# Patient Record
Sex: Female | Born: 1965 | Race: White | Hispanic: No | Marital: Married | Smoking: Never smoker
Health system: Southern US, Community
[De-identification: ages and names within clinical notes are randomized; demographics above are authoritative.]

## PROBLEM LIST (undated history)

## (undated) DIAGNOSIS — E7800 Pure hypercholesterolemia, unspecified: Secondary | ICD-10-CM

## (undated) DIAGNOSIS — B9562 Methicillin resistant Staphylococcus aureus infection as the cause of diseases classified elsewhere: Secondary | ICD-10-CM

## (undated) DIAGNOSIS — I10 Essential (primary) hypertension: Secondary | ICD-10-CM

## (undated) DIAGNOSIS — L0390 Cellulitis, unspecified: Secondary | ICD-10-CM

## (undated) HISTORY — PX: ABDOMINAL HYSTERECTOMY: SHX81

---

## 2003-01-16 ENCOUNTER — Encounter: Payer: Self-pay | Admitting: Emergency Medicine

## 2003-01-16 ENCOUNTER — Emergency Department (HOSPITAL_COMMUNITY): Admission: EM | Admit: 2003-01-16 | Discharge: 2003-01-16 | Payer: Self-pay | Admitting: Emergency Medicine

## 2003-04-14 ENCOUNTER — Emergency Department (HOSPITAL_COMMUNITY): Admission: EM | Admit: 2003-04-14 | Discharge: 2003-04-15 | Payer: Self-pay

## 2005-04-05 ENCOUNTER — Encounter: Admission: RE | Admit: 2005-04-05 | Discharge: 2005-04-05 | Payer: Self-pay | Admitting: Family Medicine

## 2007-08-19 ENCOUNTER — Ambulatory Visit: Payer: Self-pay | Admitting: Surgery

## 2008-11-16 ENCOUNTER — Emergency Department (HOSPITAL_BASED_OUTPATIENT_CLINIC_OR_DEPARTMENT_OTHER): Admission: EM | Admit: 2008-11-16 | Discharge: 2008-11-16 | Payer: Self-pay | Admitting: Emergency Medicine

## 2008-12-27 ENCOUNTER — Emergency Department (HOSPITAL_BASED_OUTPATIENT_CLINIC_OR_DEPARTMENT_OTHER): Admission: EM | Admit: 2008-12-27 | Discharge: 2008-12-27 | Payer: Self-pay | Admitting: Emergency Medicine

## 2008-12-27 ENCOUNTER — Ambulatory Visit: Payer: Self-pay | Admitting: Radiology

## 2010-04-20 IMAGING — CR DG CHEST 2V
2 series · 2 of 2 positions shown · non-contrast
Comparison: None

CLINICAL DATA: Right eye infection.  productive cough.
Hypertension.

CHEST - 2 VIEW

[w chest pa]
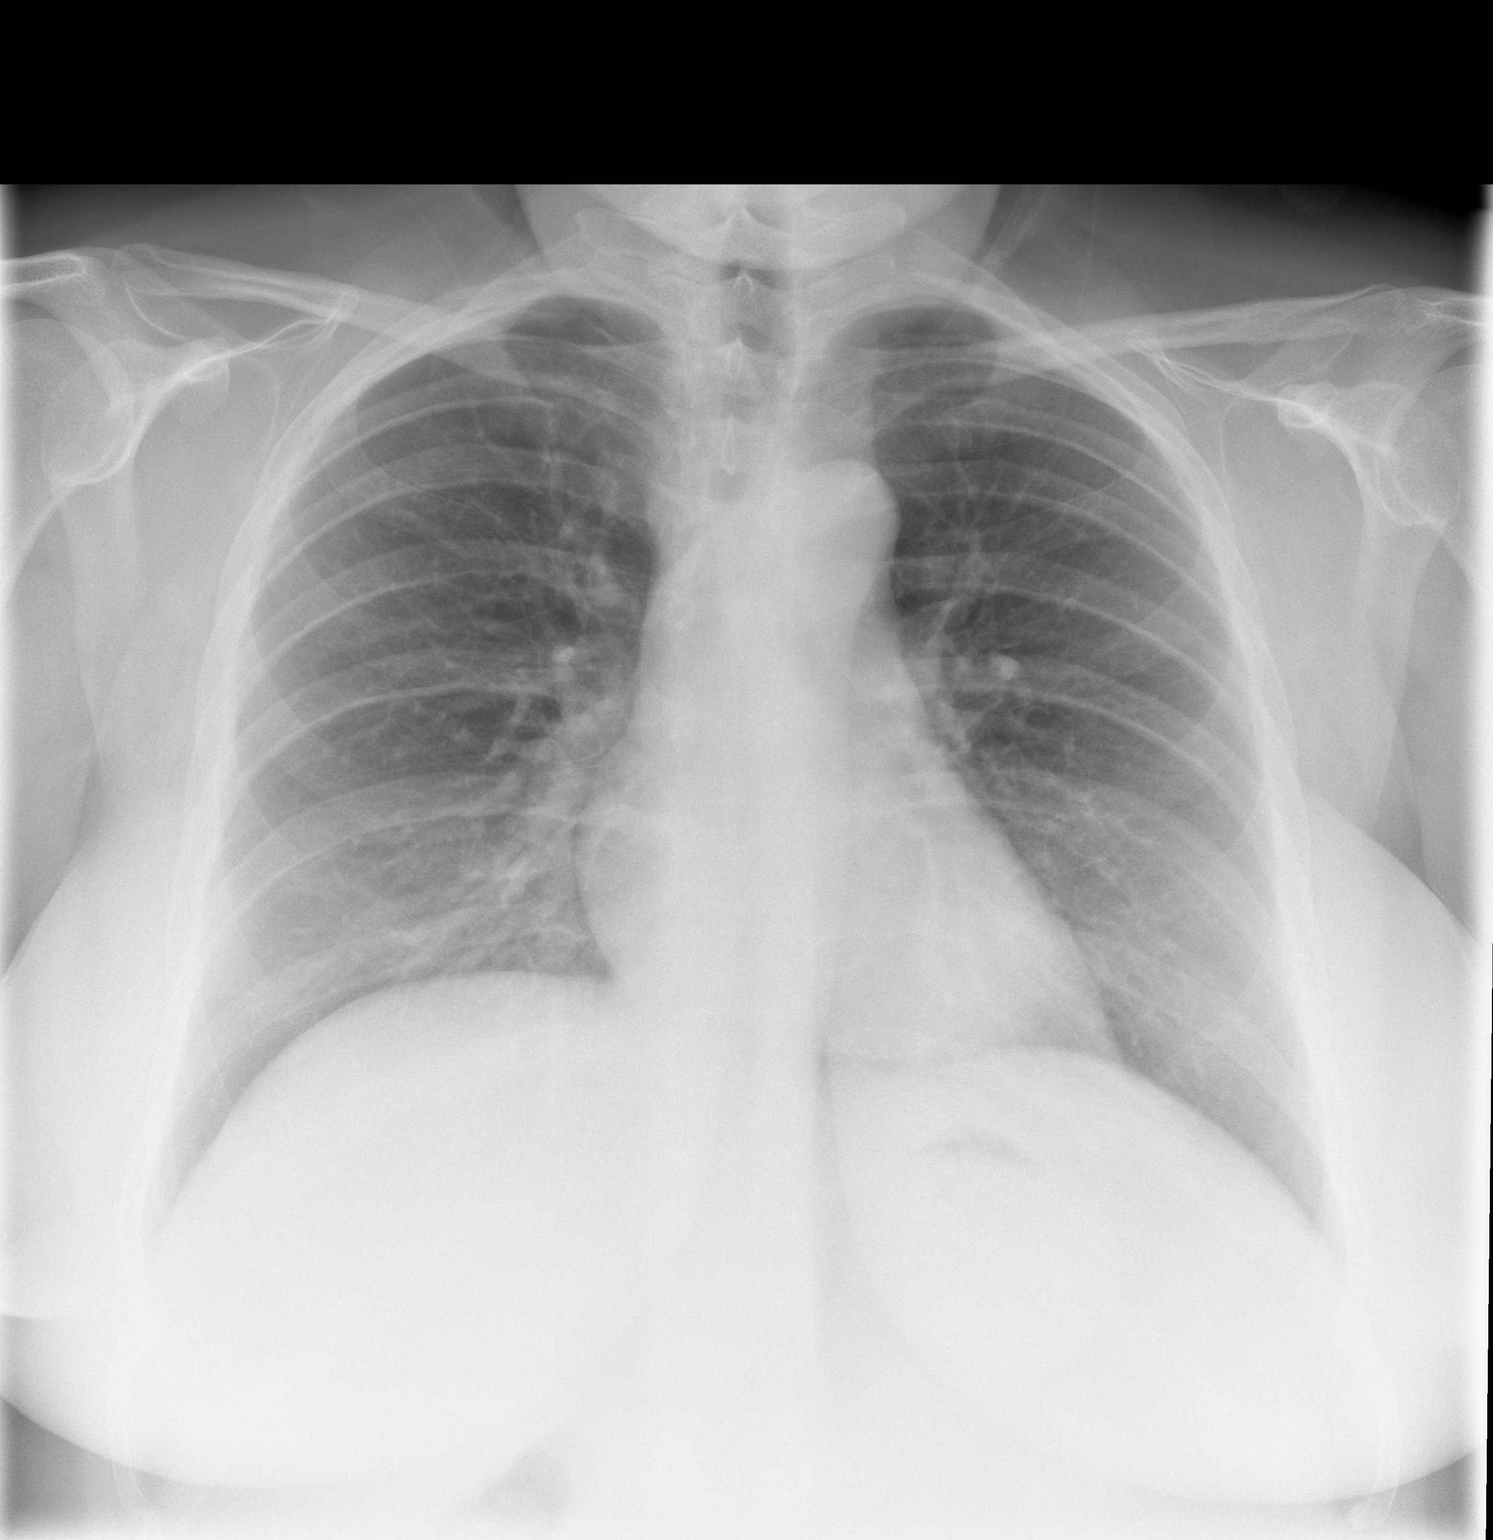

[w chest lat]
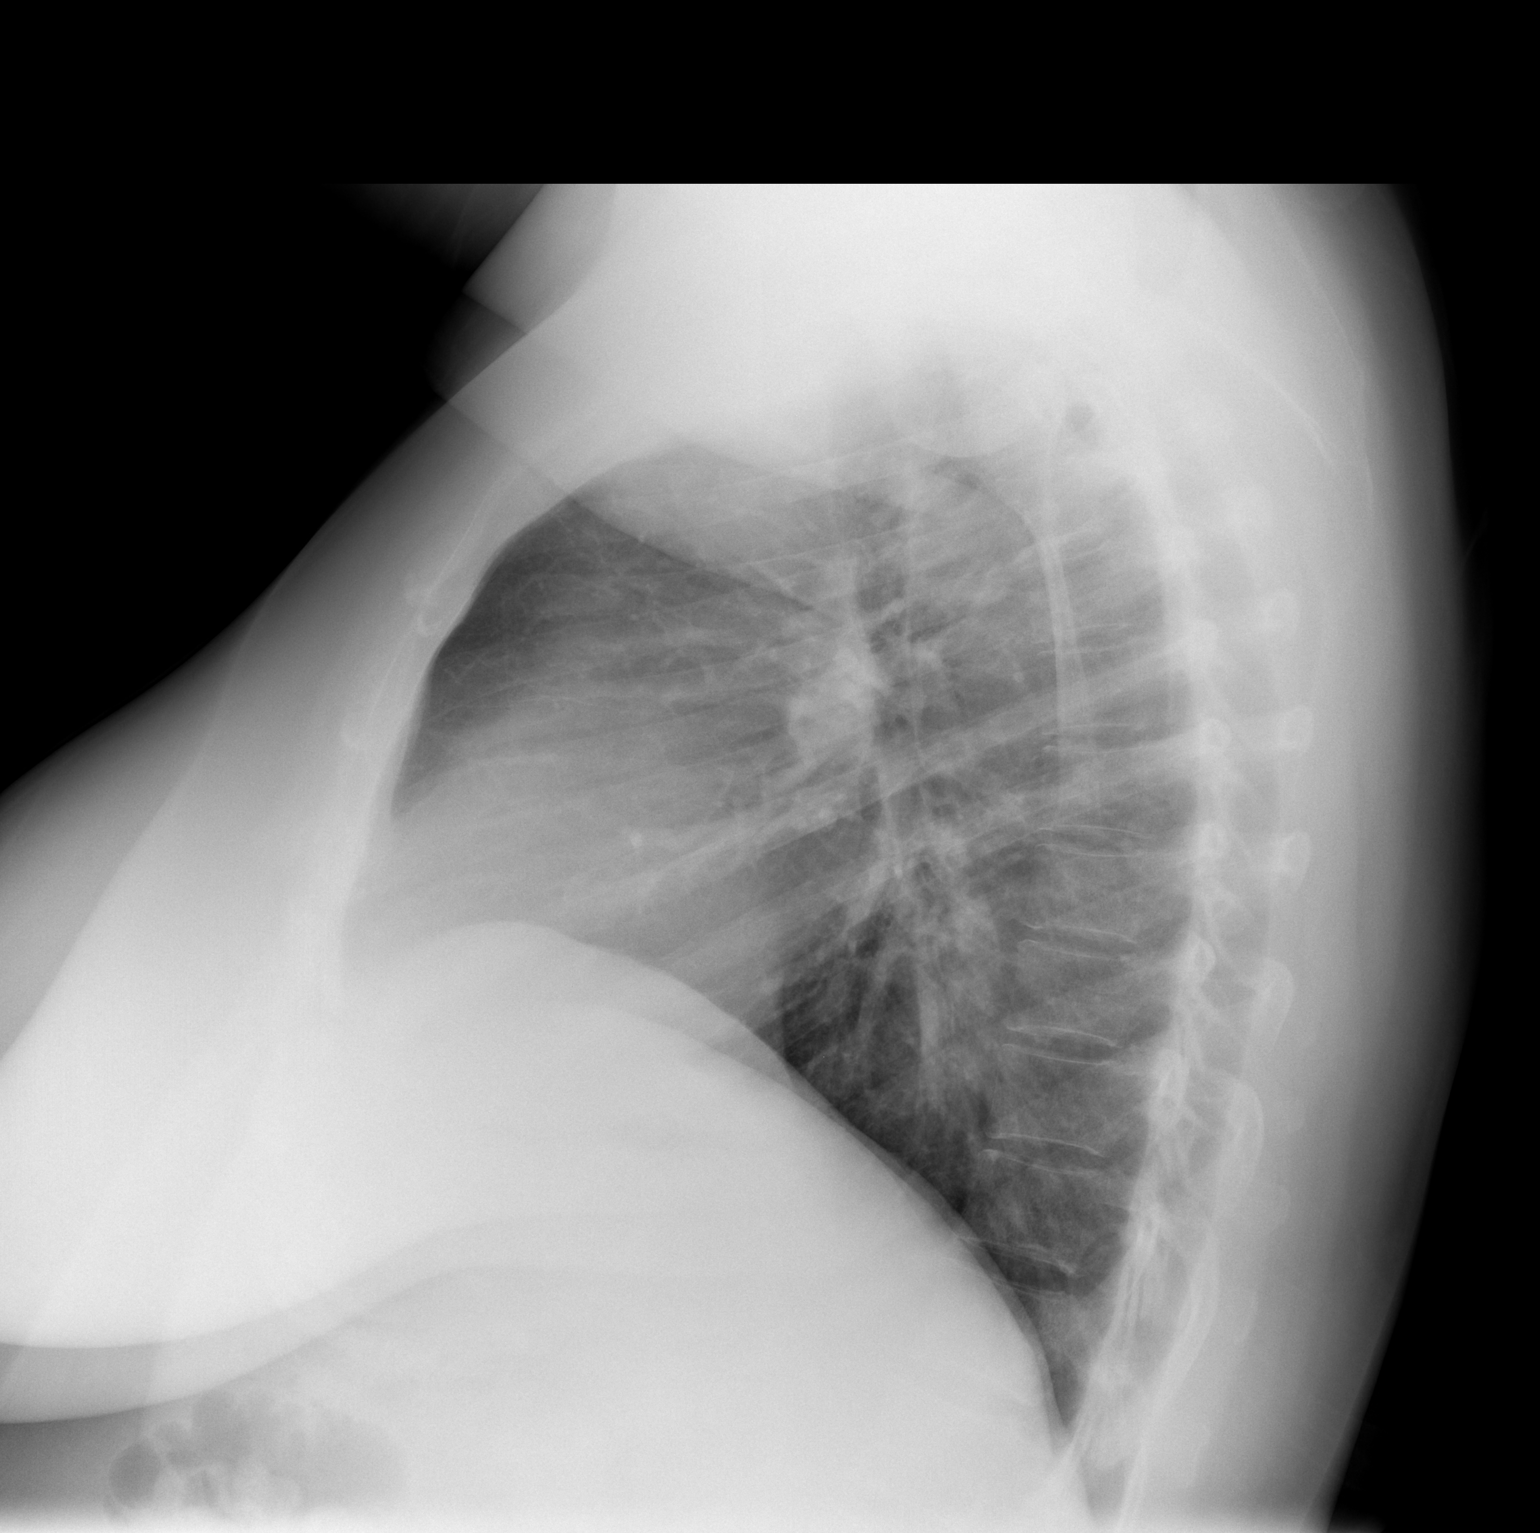

[2 of 2 positions shown; findings below may reference images not displayed]

FINDINGS: Normal cardiomediastinal silhouette size and contours.
No active infiltrate.  Very minimal accentuation of peribronchial
markings consistent with chronic bronchitic changes.
IMPRESSION: Negative for pneumonia.  Minimal chronic peribronchial markings.

## 2011-02-02 LAB — WOUND CULTURE: Gram Stain: NONE SEEN

## 2011-03-07 NOTE — Procedures (Signed)
LOWER EXTREMITY ARTERIAL EVALUATION-SINGLE LEVEL   INDICATION:  Legs hurt at night.  The patient denies claudication.   HISTORY:  Diabetes:  No  Cardiac:  No  Hypertension:  Yes  Smoking:  No, quit 19 years ago  Previous Surgery:  No   RESTING SYSTOLIC PRESSURES: (ABI)                          RIGHT                LEFT  Brachial:               110                  114  Anterior tibial:        122                  84  Posterior tibial:       140 (>1.0)           142 (>1.0)  Peroneal:  DOPPLER WAVEFORM ANALYSIS:  Anterior tibial:        Biphasic             Biphasic  Posterior tibial:       Triphasic            Triphasic  Peroneal:   PREVIOUS ABI'S:  Date:  RIGHT:  LEFT:   IMPRESSION:  No evidence of lower extremities arterial occlusive disease  bilaterally.   ___________________________________________  V. Charlena Cross, MD   DP/MEDQ  D:  08/19/2007  T:  08/20/2007  Job:  [id number]

## 2011-03-07 NOTE — Procedures (Signed)
CAROTID DUPLEX EXAM   INDICATION:  Amaurosis fugax   HISTORY:  Diabetes:  No  Cardiac:  No  Hypertension:  Yes  Smoking:  Quit 19 years ago  Previous Surgery:  No  CV History:  The patient has periodic episodes of blindness which she  describes as being like looking through water in both eyes.  Amaurosis Fugax:  Yes , Paresthesias:  No, Hemiparesis:  No                                       RIGHT             LEFT  Brachial systolic pressure:         110               114  Brachial Doppler waveforms:         Triphasic         Triphasic  Vertebral direction of flow:        Antegrade         Antegrade  DUPLEX VELOCITIES (cm/sec)  CCA peak systolic                   91                106  ECA peak systolic                   101               80  ICA peak systolic                   81                63  ICA end diastolic                   39                27  PLAQUE MORPHOLOGY:                  None              None  PLAQUE AMOUNT:                      None              None  PLAQUE LOCATION:                    None              None   IMPRESSION:  No internal carotid artery stenosis bilaterally.   ___________________________________________  V. Charlena Cross, MD   MC/MEDQ  D:  08/19/2007  T:  08/20/2007  Job:  [id number]

## 2011-03-07 NOTE — Assessment & Plan Note (Signed)
OFFICE VISIT   Dana Crawford, Dana Crawford  DOB:  1966-10-05                                       08/19/2007  CHART#:[id number]   REASON FOR VISIT:  Evaluate leg pain.   HISTORY:  This is a 45 year old female I am seeing at the request of Dr.  Wende Bushy for evaluation of left foot pain.  The patient, approximately 1  month ago, was hit by a door on her left foot, and then approximately 1  week ago rolled over on her foot causing her a significant amount of  pain.  This led to evaluation by her physician, which entailed an x-ray,  which did not show any evidence of a fracture.  However, during the  evaluation, they were unable to palpate a pulse.  She is sent to me for  further evaluation.  She states that she has never had any episodes like  this in the past.  The pain is somewhat improving.  Movement exacerbated  the pain.  Rest and antiinflammatories have improved it.   REVIEW OF SYSTEMS:  GENERAL:  Negative.  CARDIAC:  Negative.  PULMONARY:  Negative.  GASTROINTESTINAL:  Negative.  GENITOURINARY:  Negative.  VASCULAR:  Negative.  NEURO:  Positive for dizziness.  ORTHO:  Negative.  PSYCH:  Negative.  ENT:  Negative.  HEMATOLOGICAL:  Negative.   PAST MEDICAL HISTORY:  Hypertension.  Hypercholesterolemia.   PAST SURGICAL HISTORY:  Total abdominal hysterectomy and left foot  surgery.   FAMILY HISTORY:  Heart disease in her father.  Otherwise, negative.   SOCIAL HISTORY:  She is married with 3 children.  Does not smoke.  Has a  history of smoking.  Quit 19 years ago.  Does not drink.   MEDICATIONS:  Include Lotrel and Lipitor.   ALLERGIES:  None.   PHYSICAL EXAMINATION:  Heart rate is 72, blood pressure 133/92,  respirations 18.  In general, she is well-appearing, in no acute  distress.  HEENT, she is normocephalic, atraumatic.  Sclerae anicteric.  Her neck is supple without masses.  There are no carotid bruits.  Cardiovascular is regular rate and rhythm  with no murmurs, rubs, or  gallops.  Lungs are clear to auscultation bilaterally.  Extremities are  warm and well-perfused.  It is difficult to palpate pedal pulses.  Femoral pulses are strong.   DIAGNOSTIC STUDIES:  The patient had a duplex evaluation that shows  normal arterial wave forms in her lower extremity vasculature.   I also duplexed the patient's carotid arteries, given that she reported  temporary blindness in 1 eye.  This reveals no evidence of internal  carotid artery stenosis bilaterally.   ASSESSMENT AND PLAN:  Left leg and foot pain with non-palpable pulses.   PLAN:  Based on duplex imaging, the patient does not have arterial  insufficiency in her lower extremities.  I also scanned her carotids  given her history of temporary blindness.  These were also within normal  limits.  I reassured the patient that she should improve and that she  does not have any arterial insufficiency.  She will follow up with me on  a p.r.n. basis.   Jorge Ny, MD  Electronically Signed   VWB/MEDQ  D:  09/02/2007  T:  09/03/2007  Job:  [id number]   cc:   Dr. Wende Bushy

## 2016-04-07 ENCOUNTER — Encounter (HOSPITAL_BASED_OUTPATIENT_CLINIC_OR_DEPARTMENT_OTHER): Payer: Self-pay | Admitting: *Deleted

## 2016-04-07 ENCOUNTER — Emergency Department (HOSPITAL_BASED_OUTPATIENT_CLINIC_OR_DEPARTMENT_OTHER)
Admission: EM | Admit: 2016-04-07 | Discharge: 2016-04-07 | Disposition: A | Discharge status: Other Acute Inpatient Hospital | Payer: BLUE CROSS/BLUE SHIELD | Attending: Emergency Medicine | Admitting: Emergency Medicine

## 2016-04-07 DIAGNOSIS — E7800 Pure hypercholesterolemia, unspecified: Secondary | ICD-10-CM | POA: Diagnosis not present

## 2016-04-07 DIAGNOSIS — R110 Nausea: Secondary | ICD-10-CM | POA: Diagnosis not present

## 2016-04-07 DIAGNOSIS — Z79899 Other long term (current) drug therapy: Secondary | ICD-10-CM | POA: Insufficient documentation

## 2016-04-07 DIAGNOSIS — I10 Essential (primary) hypertension: Secondary | ICD-10-CM | POA: Diagnosis not present

## 2016-04-07 DIAGNOSIS — L03116 Cellulitis of left lower limb: Secondary | ICD-10-CM

## 2016-04-07 HISTORY — DX: Methicillin resistant Staphylococcus aureus infection as the cause of diseases classified elsewhere: B95.62

## 2016-04-07 HISTORY — DX: Essential (primary) hypertension: I10

## 2016-04-07 HISTORY — DX: Cellulitis, unspecified: L03.90

## 2016-04-07 HISTORY — DX: Pure hypercholesterolemia, unspecified: E78.00

## 2016-04-07 LAB — CBC WITH DIFFERENTIAL/PLATELET
BASOS ABS: 0 10*3/uL (ref 0.0–0.1)
BASOS PCT: 0 %
Eosinophils Absolute: 0.2 10*3/uL (ref 0.0–0.7)
Eosinophils Relative: 3 %
HEMATOCRIT: 37.9 % (ref 36.0–46.0)
HEMOGLOBIN: 12.9 g/dL (ref 12.0–15.0)
Lymphocytes Relative: 42 %
Lymphs Abs: 3.2 10*3/uL (ref 0.7–4.0)
MCH: 30.8 pg (ref 26.0–34.0)
MCHC: 34 g/dL (ref 30.0–36.0)
MCV: 90.5 fL (ref 78.0–100.0)
Monocytes Absolute: 0.5 10*3/uL (ref 0.1–1.0)
Monocytes Relative: 6 %
NEUTROS ABS: 3.7 10*3/uL (ref 1.7–7.7)
NEUTROS PCT: 49 %
Platelets: 356 10*3/uL (ref 150–400)
RBC: 4.19 MIL/uL (ref 3.87–5.11)
RDW: 12.7 % (ref 11.5–15.5)
WBC: 7.7 10*3/uL (ref 4.0–10.5)

## 2016-04-07 LAB — BASIC METABOLIC PANEL
ANION GAP: 9 (ref 5–15)
BUN: 18 mg/dL (ref 6–20)
CHLORIDE: 102 mmol/L (ref 101–111)
CO2: 27 mmol/L (ref 22–32)
Calcium: 9.4 mg/dL (ref 8.9–10.3)
Creatinine, Ser: 0.84 mg/dL (ref 0.44–1.00)
GFR calc non Af Amer: >60 mL/min (ref >60)
Glucose, Bld: 96 mg/dL (ref 65–99)
Potassium: 4 mmol/L (ref 3.5–5.1)
SODIUM: 138 mmol/L (ref 135–145)

## 2016-04-07 MED ORDER — VANCOMYCIN HCL IN DEXTROSE 1-5 GM/200ML-% IV SOLN
1000.0000 mg | Freq: Once | INTRAVENOUS | Status: AC
  Administered 2016-04-07: 1000 mg via INTRAVENOUS
  Filled 2016-04-07: qty 200

## 2016-04-07 NOTE — ED Provider Notes (Signed)
CSN: [id number]     Arrival date & time 04/07/16  1700 History  By signing my name below, I, Dana Crawford, attest that this documentation has been prepared under the direction and in the presence of Jaynie Crumble, PA-C  Electronically Signed: Gillis Ends. Lyn Hollingshead, ED Scribe. 04/07/2016. 7:02 PM.   Chief Complaint  Patient presents with  . Cellulitis   The history is provided by the patient. No language interpreter was used.   HPI Comments: Dana Crawford is a 50 y.o. female with PMHx of MRSA, HTN, and HLD who presents to the Emergency Department complaining of gradual onset, constant, worsening left lower extremity pain due to MRSA Cellulitis x 1 week and 2 days ago. She states she has been visiting PCP everyday since 03/31/16 where she has been given various antibiotics such as Doxycycline and Bactrim via oral routes and injection with no symptom relief. She states her PCP told her to visit ED to receive IV antibiotics. She has associated lower left leg swelling, redness and nausea. She reports pain is exacerbated while walking. She states she has taken 3-4 Extra Strength Tylenol pills at a time with mild relief  She denies having a fever.   Past Medical History  Diagnosis Date  . MRSA cellulitis   . Hypertension   . High cholesterol    Past Surgical History  Procedure Laterality Date  . Abdominal hysterectomy     No family history on file. Social History  Substance Use Topics  . Smoking status: Never Smoker   . Smokeless tobacco: None  . Alcohol Use: No   OB History    No data available     Review of Systems  Constitutional: Negative for fever.  Cardiovascular: Positive for leg swelling.  Gastrointestinal: Positive for nausea.  Skin: Positive for color change and wound.  All other systems reviewed and are negative.  Allergies  Review of patient's allergies indicates no known allergies.  Home Medications   Prior to Admission medications   Medication Sig  Start Date End Date Taking? Authorizing Provider  HYDROCHLOROTHIAZIDE PO Take by mouth.   Yes Historical Provider, MD  Rosuvastatin Calcium (CRESTOR PO) Take by mouth.   Yes Historical Provider, MD   BP 137/92 mmHg  Pulse 84  Temp(Src) 98.3 F (36.8 C) (Oral)  Resp 20  Ht  (1.753 m)  Wt 250 lb (113.399 kg)  BMI 36.90 kg/m2  SpO2 98% Physical Exam  Constitutional: She is oriented to person, place, and time. She appears well-developed and well-nourished.  HENT:  Head: Normocephalic and atraumatic.  Eyes: EOM are normal. Pupils are equal, round, and reactive to light.  Neck: Normal range of motion.  Cardiovascular: Normal rate, regular rhythm and normal heart sounds.   Pulmonary/Chest: Effort normal and breath sounds normal. No respiratory distress. She has no wheezes. She has no rales.  Abdominal: Soft. Bowel sounds are normal. She exhibits no distension. There is no tenderness. There is no rebound and no guarding.  Musculoskeletal: Normal range of motion.  Open blister to the anterior left lower shin with surrounding erythema and swelling. Swelling extends over entire foot, ankle, approximately three fourth of left shin. Leg is warm to the touch, diffusely tenderness. Dorsal pedal pulse intact.  Neurological: She is alert and oriented to person, place, and time.  Skin: Skin is warm and dry. No rash noted.  Psychiatric: She has a normal mood and affect. Judgment normal.  Nursing note and vitals reviewed.   ED Course  Procedures (including critical care time) DIAGNOSTIC STUDIES: Oxygen Saturation is 98% on RA, normal by my interpretation.    COORDINATION OF CARE: 7:03 PM-Discussed treatment plan which includes CBC panel and BMP with pt at bedside and pt agreed to plan.   Labs Review Labs Reviewed - No data to display  Imaging Review No results found. I have personally reviewed and evaluated these images and lab results as part of my medical decision-making.   MDM    Final diagnoses:  Cellulitis of left lower extremity   Patient emergency department with left lower leg wound and cellulitis. She has failed 4 days of doxycycline and 3 days of Bactrim. Wound was cultured and grew MRSA, cultures to be reviewed under care everywhere. Given failed outpatient treatment, will admit for IV antibiotics. Patient is otherwise afebrile, nontoxic-appearing. CBC metabolic panel sent. Will treat with vancomycin 1 g in emergency department.  CBC, metabolic panel are normal. Again she is afebrile, and evidence of sepsis. [facility] with [facility], [facility] They will accept the patient is a transfer for cellulitis, failed outpatient treatment.  Filed Vitals:   04/07/16 1712 04/07/16 1926  BP: 137/92 148/106  Pulse: 84 71  Temp: 98.3 F (36.8 C)   TempSrc: Oral   Resp: 20 18  Height: 5\' 9"  (1.753 m)   Weight: 113.399 kg   SpO2: 98% 100%   I personally performed the services described in this documentation, which was scribed in my presence. The recorded information has been reviewed and is accurate.    Jaynie Crumbleatyana Doreather Hoxworth, PA-C 04/07/16 2111  Arby BarretteMarcy Pfeiffer, MD 04/08/16 1445

## 2016-04-07 NOTE — ED Notes (Signed)
[facility]

## 2016-04-07 NOTE — ED Notes (Signed)
Cellulitis of her left lower leg. She was sent from her MD's office for possible IV antibiotics.

## 2016-04-07 NOTE — ED Notes (Signed)
Patient ambulatory to the car, made aware of going to [facility] admission services. [facility]
